# Patient Record
Sex: Female | Born: 1965 | Race: White | Hispanic: No | Marital: Single
Health system: Southern US, Community
[De-identification: ages and names within clinical notes are randomized; demographics above are authoritative.]

---

## 2005-12-03 ENCOUNTER — Emergency Department (HOSPITAL_COMMUNITY): Admission: EM | Admit: 2005-12-03 | Discharge: 2005-12-03 | Payer: Self-pay | Admitting: Emergency Medicine

## 2006-01-05 ENCOUNTER — Other Ambulatory Visit: Admission: RE | Admit: 2006-01-05 | Discharge: 2006-01-05 | Payer: Self-pay | Admitting: Obstetrics and Gynecology

## 2006-01-20 ENCOUNTER — Encounter: Admission: RE | Admit: 2006-01-20 | Discharge: 2006-01-20 | Payer: Self-pay | Admitting: Obstetrics and Gynecology

## 2021-04-16 ENCOUNTER — Other Ambulatory Visit: Payer: Self-pay | Admitting: Specialist

## 2021-04-16 DIAGNOSIS — Z1231 Encounter for screening mammogram for malignant neoplasm of breast: Secondary | ICD-10-CM

## 2021-04-19 ENCOUNTER — Ambulatory Visit
Admission: RE | Admit: 2021-04-19 | Discharge: 2021-04-19 | Disposition: A | Discharge status: Home / Self Care | Payer: 59 | Source: Ambulatory Visit | Attending: Specialist | Admitting: Specialist

## 2021-04-19 DIAGNOSIS — Z1231 Encounter for screening mammogram for malignant neoplasm of breast: Secondary | ICD-10-CM

## 2021-05-13 ENCOUNTER — Other Ambulatory Visit: Payer: Self-pay

## 2021-05-13 ENCOUNTER — Ambulatory Visit (INDEPENDENT_AMBULATORY_CARE_PROVIDER_SITE_OTHER): Payer: 59 | Admitting: Psychiatry

## 2021-05-13 ENCOUNTER — Ambulatory Visit: Payer: 59 | Admitting: Psychiatry

## 2021-05-13 DIAGNOSIS — F411 Generalized anxiety disorder: Secondary | ICD-10-CM | POA: Diagnosis not present
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
## 2021-05-22 ENCOUNTER — Ambulatory Visit (INDEPENDENT_AMBULATORY_CARE_PROVIDER_SITE_OTHER): Payer: 59 | Admitting: Psychiatry

## 2021-05-22 ENCOUNTER — Other Ambulatory Visit: Payer: Self-pay

## 2021-05-22 DIAGNOSIS — F411 Generalized anxiety disorder: Secondary | ICD-10-CM
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
## 2021-05-30 ENCOUNTER — Ambulatory Visit: Payer: 59 | Admitting: Psychiatry

## 2021-06-12 ENCOUNTER — Ambulatory Visit (INDEPENDENT_AMBULATORY_CARE_PROVIDER_SITE_OTHER): Payer: 59 | Admitting: Psychiatry

## 2021-06-12 ENCOUNTER — Other Ambulatory Visit: Payer: Self-pay

## 2021-06-12 DIAGNOSIS — F411 Generalized anxiety disorder: Secondary | ICD-10-CM | POA: Diagnosis not present
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
## 2021-06-26 ENCOUNTER — Other Ambulatory Visit: Payer: Self-pay

## 2021-06-26 ENCOUNTER — Ambulatory Visit (INDEPENDENT_AMBULATORY_CARE_PROVIDER_SITE_OTHER): Payer: 59 | Admitting: Psychiatry

## 2021-06-26 DIAGNOSIS — F411 Generalized anxiety disorder: Secondary | ICD-10-CM | POA: Diagnosis not present
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
## 2021-07-11 ENCOUNTER — Ambulatory Visit (INDEPENDENT_AMBULATORY_CARE_PROVIDER_SITE_OTHER): Payer: 59 | Admitting: Psychiatry

## 2021-07-11 ENCOUNTER — Other Ambulatory Visit: Payer: Self-pay

## 2021-07-11 DIAGNOSIS — F411 Generalized anxiety disorder: Secondary | ICD-10-CM
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
## 2021-07-24 ENCOUNTER — Ambulatory Visit: Payer: 59 | Admitting: Psychiatry

## 2021-08-18 ENCOUNTER — Other Ambulatory Visit: Payer: Self-pay

## 2021-08-18 ENCOUNTER — Ambulatory Visit (INDEPENDENT_AMBULATORY_CARE_PROVIDER_SITE_OTHER): Payer: 59 | Admitting: Psychiatry

## 2021-08-18 DIAGNOSIS — F411 Generalized anxiety disorder: Secondary | ICD-10-CM | POA: Diagnosis not present
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
## 2021-09-01 ENCOUNTER — Ambulatory Visit: Payer: 59 | Admitting: Psychiatry

## 2022-04-06 ENCOUNTER — Other Ambulatory Visit: Payer: Self-pay | Admitting: Specialist

## 2022-04-06 DIAGNOSIS — Z1231 Encounter for screening mammogram for malignant neoplasm of breast: Secondary | ICD-10-CM

## 2022-04-20 ENCOUNTER — Ambulatory Visit
Admission: RE | Admit: 2022-04-20 | Discharge: 2022-04-20 | Disposition: A | Discharge status: Home / Self Care | Payer: 59 | Source: Ambulatory Visit

## 2022-04-20 DIAGNOSIS — Z1231 Encounter for screening mammogram for malignant neoplasm of breast: Secondary | ICD-10-CM

## 2022-08-15 IMAGING — MG MM DIGITAL SCREENING BILAT W/ TOMO AND CAD
8 series · 8 of 24 positions shown · non-contrast
Comparison: Previous exam(s).

CLINICAL DATA: Screening.

EXAM:
DIGITAL SCREENING BILATERAL MAMMOGRAM WITH TOMOSYNTHESIS AND CAD
TECHNIQUE: Bilateral screening digital craniocaudal and mediolateral oblique
mammograms were obtained. Bilateral screening digital breast
tomosynthesis was performed. The images were evaluated with
computer-aided detection.

[R MLO synth-2D]
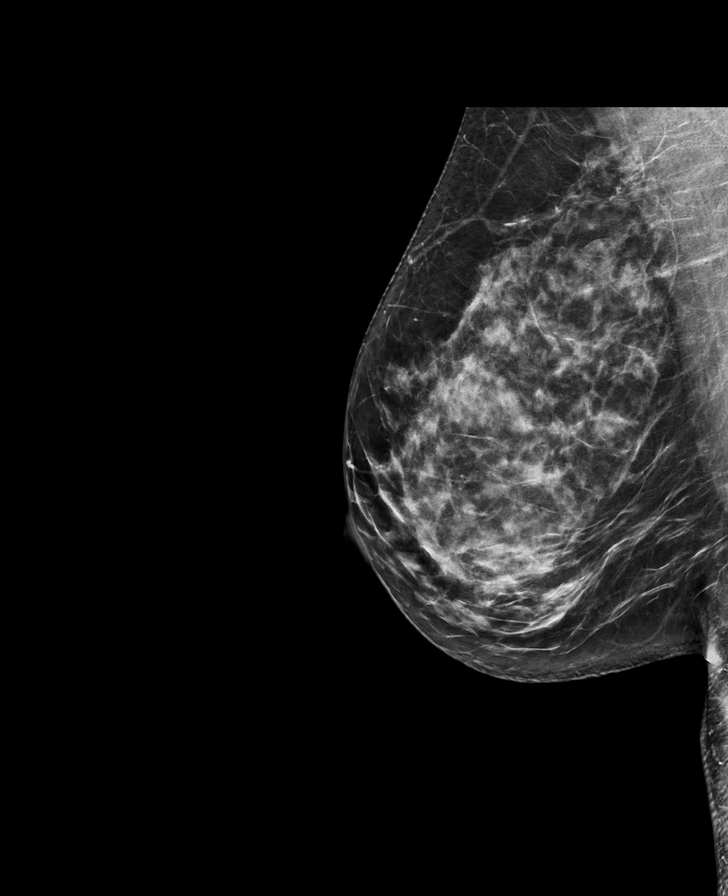

[L CC synth-2D]
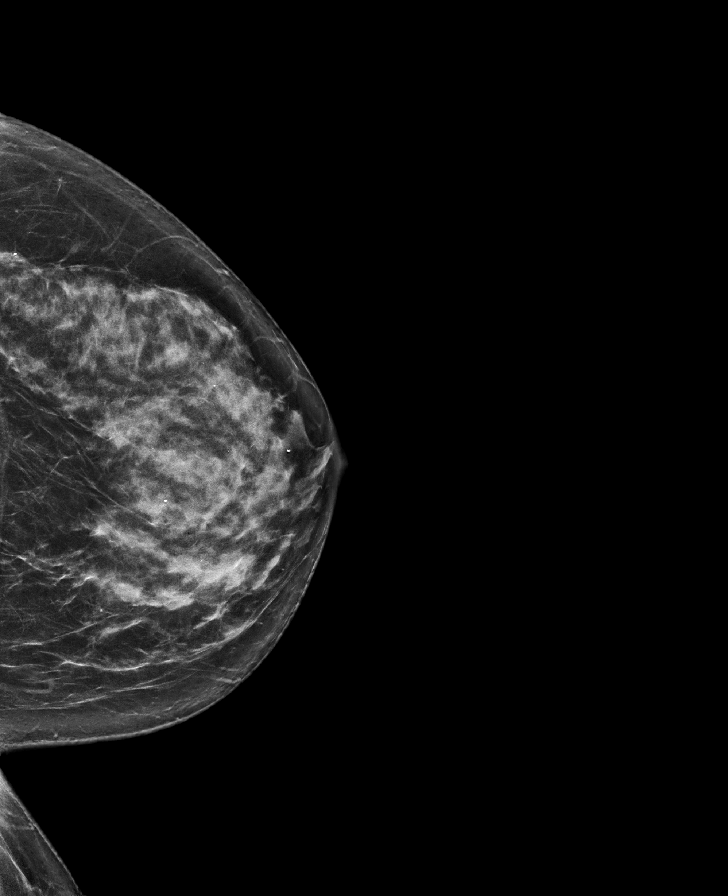

[R CC synth-2D]
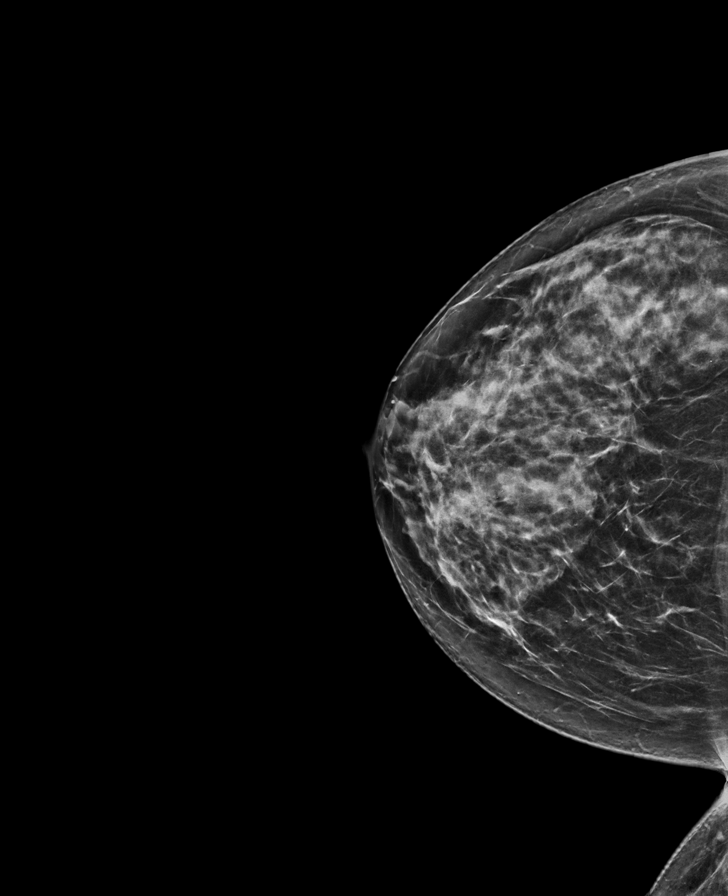

[L MLO synth-2D]
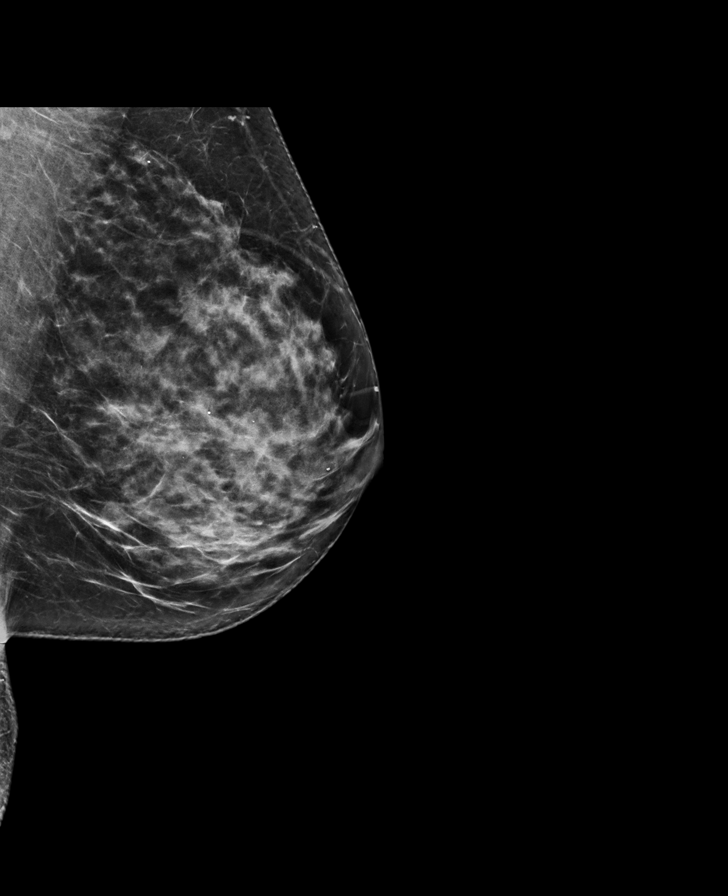

[R MLO tomo · tomo slice 39/77.0]
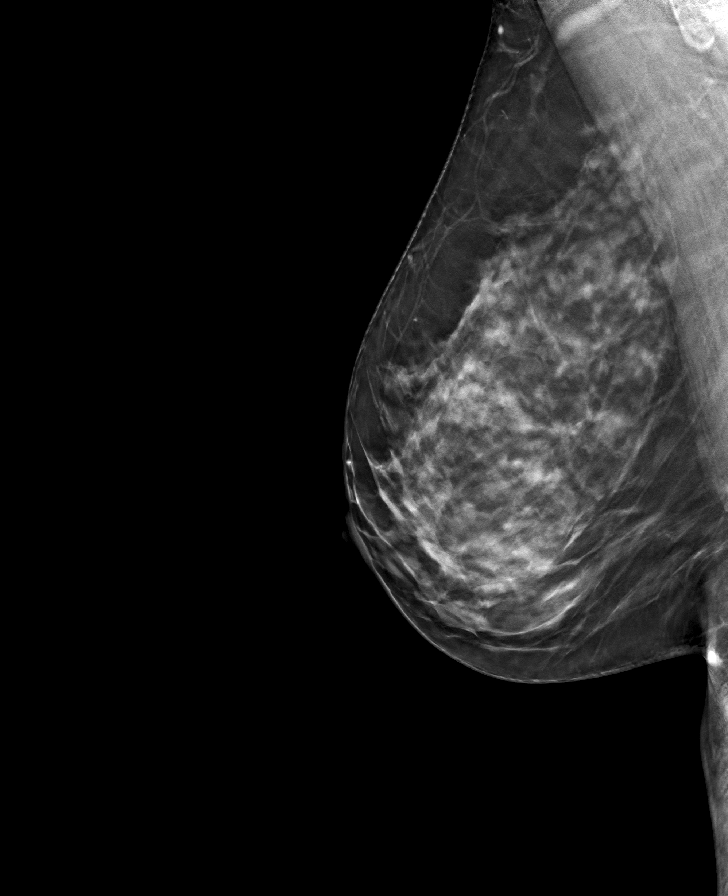

[L CC tomo · tomo slice 38/75.0]
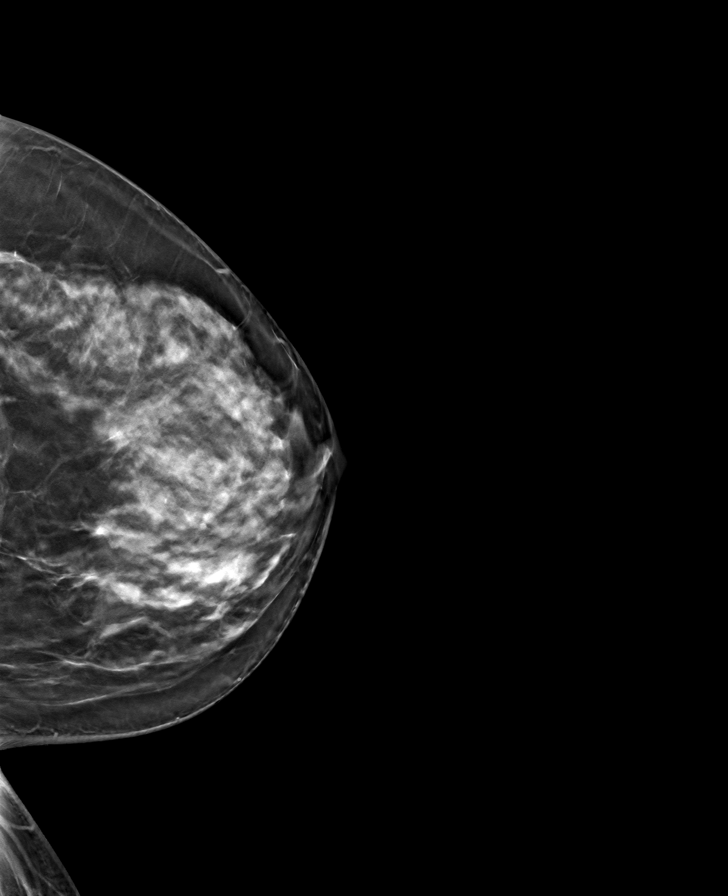

[L MLO tomo · tomo slice 39/76.0]
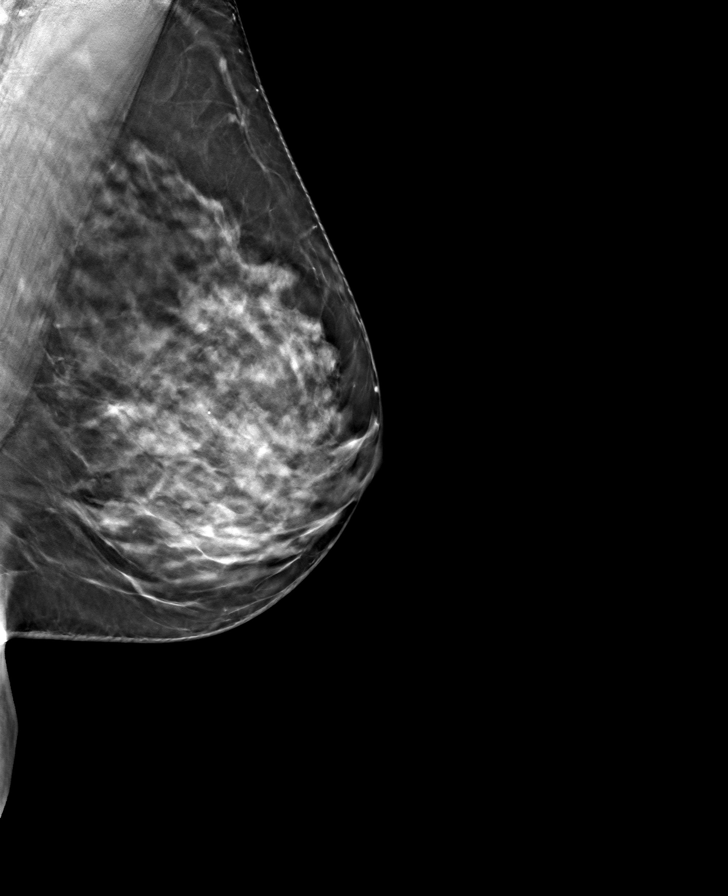

[R CC tomo · tomo slice 37/74.0]
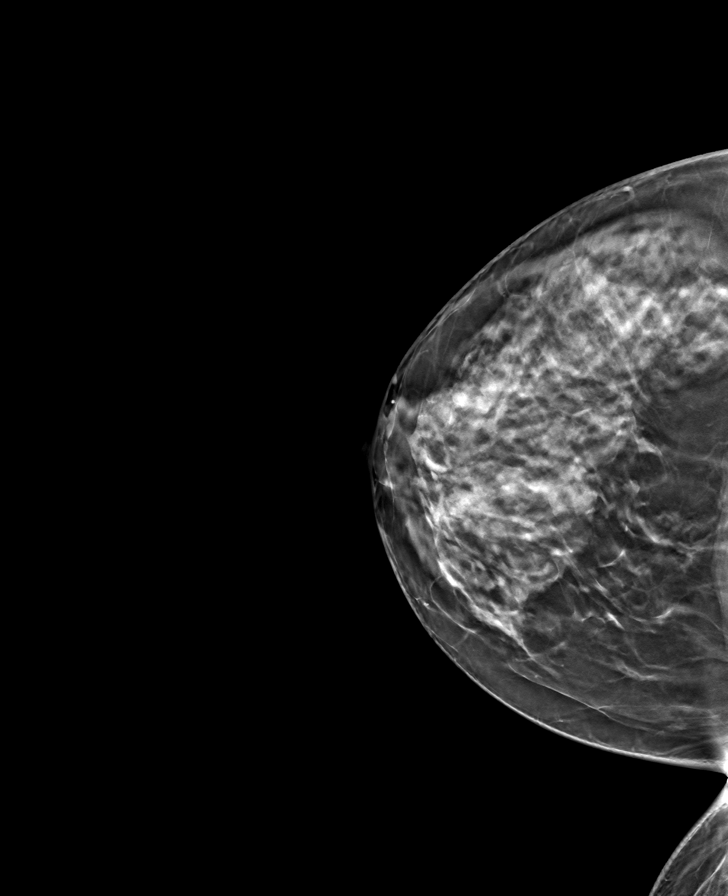

[8 of 24 positions shown; findings below may reference images not displayed]

ACR Breast Density Category d: The breast tissue is extremely dense,
which lowers the sensitivity of mammography
FINDINGS: There are no findings suspicious for malignancy.
IMPRESSION: No mammographic evidence of malignancy. A result letter of this
screening mammogram will be mailed directly to the patient.

RECOMMENDATION:
Screening mammogram in one year. (Code:[id number])

BI-RADS CATEGORY  1: Negative.

## 2023-03-23 ENCOUNTER — Other Ambulatory Visit: Payer: Self-pay | Admitting: Specialist

## 2023-03-23 DIAGNOSIS — Z1231 Encounter for screening mammogram for malignant neoplasm of breast: Secondary | ICD-10-CM

## 2023-04-22 ENCOUNTER — Ambulatory Visit
Admission: RE | Admit: 2023-04-22 | Discharge: 2023-04-22 | Disposition: A | Discharge status: Home / Self Care | Payer: 59 | Source: Ambulatory Visit | Attending: Specialist | Admitting: Specialist

## 2023-04-22 DIAGNOSIS — Z1231 Encounter for screening mammogram for malignant neoplasm of breast: Secondary | ICD-10-CM

## 2024-05-11 ENCOUNTER — Other Ambulatory Visit: Payer: Self-pay | Admitting: Specialist

## 2024-05-11 DIAGNOSIS — Z1231 Encounter for screening mammogram for malignant neoplasm of breast: Secondary | ICD-10-CM

## 2024-05-17 ENCOUNTER — Ambulatory Visit
Admission: RE | Admit: 2024-05-17 | Discharge: 2024-05-17 | Disposition: A | Discharge status: Home / Self Care | Source: Ambulatory Visit

## 2024-05-17 DIAGNOSIS — Z1231 Encounter for screening mammogram for malignant neoplasm of breast: Secondary | ICD-10-CM
